# Patient Record
Sex: Female | Born: 1970 | Race: White | Hispanic: No | Marital: Single | Smoking: Current every day smoker
Health system: Southern US, Community
[De-identification: ages and names within clinical notes are randomized; demographics above are authoritative.]

---

## 2005-04-26 ENCOUNTER — Other Ambulatory Visit: Admission: RE | Admit: 2005-04-26 | Discharge: 2005-04-26 | Payer: Self-pay | Admitting: Obstetrics and Gynecology

## 2007-01-08 ENCOUNTER — Other Ambulatory Visit: Admission: RE | Admit: 2007-01-08 | Discharge: 2007-01-08 | Payer: Self-pay | Admitting: Obstetrics and Gynecology

## 2007-05-25 ENCOUNTER — Encounter: Admission: RE | Admit: 2007-05-25 | Discharge: 2007-05-25 | Payer: Self-pay | Admitting: Family Medicine

## 2008-03-31 ENCOUNTER — Encounter: Admission: RE | Admit: 2008-03-31 | Discharge: 2008-03-31 | Payer: Self-pay | Admitting: Family Medicine

## 2015-06-05 ENCOUNTER — Telehealth: Payer: Self-pay | Admitting: Gynecology

## 2015-06-05 NOTE — Telephone Encounter (Signed)
Erroneous entry

## 2016-11-27 ENCOUNTER — Encounter (HOSPITAL_COMMUNITY): Payer: Self-pay | Admitting: Emergency Medicine

## 2016-11-27 ENCOUNTER — Emergency Department (HOSPITAL_COMMUNITY): Payer: 59

## 2016-11-27 ENCOUNTER — Emergency Department (HOSPITAL_COMMUNITY)
Admission: EM | Admit: 2016-11-27 | Discharge: 2016-11-27 | Disposition: A | Discharge status: Home / Self Care | Payer: 59 | Attending: Emergency Medicine | Admitting: Emergency Medicine

## 2016-11-27 DIAGNOSIS — Z5181 Encounter for therapeutic drug level monitoring: Secondary | ICD-10-CM | POA: Diagnosis not present

## 2016-11-27 DIAGNOSIS — R55 Syncope and collapse: Secondary | ICD-10-CM

## 2016-11-27 DIAGNOSIS — F17200 Nicotine dependence, unspecified, uncomplicated: Secondary | ICD-10-CM | POA: Diagnosis not present

## 2016-11-27 DIAGNOSIS — F10120 Alcohol abuse with intoxication, uncomplicated: Secondary | ICD-10-CM | POA: Insufficient documentation

## 2016-11-27 DIAGNOSIS — F10920 Alcohol use, unspecified with intoxication, uncomplicated: Secondary | ICD-10-CM

## 2016-11-27 LAB — COMPREHENSIVE METABOLIC PANEL
ALK PHOS: 47 U/L (ref 38–126)
ALT: 14 U/L (ref 14–54)
ANION GAP: 8 (ref 5–15)
AST: 20 U/L (ref 15–41)
Albumin: 3.7 g/dL (ref 3.5–5.0)
BILIRUBIN TOTAL: 0.3 mg/dL (ref 0.3–1.2)
BUN: 7 mg/dL (ref 6–20)
CALCIUM: 8.1 mg/dL — AB (ref 8.9–10.3)
CO2: 22 mmol/L (ref 22–32)
CREATININE: 0.67 mg/dL (ref 0.44–1.00)
Chloride: 104 mmol/L (ref 101–111)
GLUCOSE: 105 mg/dL — AB (ref 65–99)
Potassium: 3.9 mmol/L (ref 3.5–5.1)
Sodium: 134 mmol/L — ABNORMAL LOW (ref 135–145)
Total Protein: 7.3 g/dL (ref 6.5–8.1)

## 2016-11-27 LAB — I-STAT TROPONIN, ED: Troponin i, poc: 0 ng/mL (ref 0.00–0.08)

## 2016-11-27 LAB — CBC WITH DIFFERENTIAL/PLATELET
Basophils Absolute: 0 10*3/uL (ref 0.0–0.1)
Basophils Relative: 0 %
EOS ABS: 0.1 10*3/uL (ref 0.0–0.7)
EOS PCT: 1 %
HCT: 36.4 % (ref 36.0–46.0)
Hemoglobin: 12.4 g/dL (ref 12.0–15.0)
LYMPHS ABS: 1.6 10*3/uL (ref 0.7–4.0)
Lymphocytes Relative: 17 %
MCH: 34 pg (ref 26.0–34.0)
MCHC: 34.1 g/dL (ref 30.0–36.0)
MCV: 99.7 fL (ref 78.0–100.0)
MONOS PCT: 4 %
Monocytes Absolute: 0.4 10*3/uL (ref 0.1–1.0)
Neutro Abs: 7.1 10*3/uL (ref 1.7–7.7)
Neutrophils Relative %: 78 %
PLATELETS: 203 10*3/uL (ref 150–400)
RBC: 3.65 MIL/uL — ABNORMAL LOW (ref 3.87–5.11)
RDW: 12.1 % (ref 11.5–15.5)
WBC: 9.1 10*3/uL (ref 4.0–10.5)

## 2016-11-27 LAB — URINALYSIS, ROUTINE W REFLEX MICROSCOPIC
Bilirubin Urine: NEGATIVE
Glucose, UA: NEGATIVE mg/dL
HGB URINE DIPSTICK: NEGATIVE
Ketones, ur: NEGATIVE mg/dL
LEUKOCYTES UA: NEGATIVE
Nitrite: NEGATIVE
PROTEIN: NEGATIVE mg/dL
Specific Gravity, Urine: 1.002 — ABNORMAL LOW (ref 1.005–1.030)
pH: 5 (ref 5.0–8.0)

## 2016-11-27 LAB — CK: CK TOTAL: 82 U/L (ref 38–234)

## 2016-11-27 LAB — RAPID URINE DRUG SCREEN, HOSP PERFORMED
Amphetamines: NOT DETECTED
BARBITURATES: NOT DETECTED
Benzodiazepines: NOT DETECTED
Cocaine: NOT DETECTED
Opiates: NOT DETECTED
Tetrahydrocannabinol: NOT DETECTED

## 2016-11-27 LAB — ETHANOL: Alcohol, Ethyl (B): 192 mg/dL — ABNORMAL HIGH (ref <5)

## 2016-11-27 LAB — I-STAT BETA HCG BLOOD, ED (MC, WL, AP ONLY)

## 2016-11-27 LAB — I-STAT CG4 LACTIC ACID, ED: Lactic Acid, Venous: 1.69 mmol/L (ref 0.5–1.9)

## 2016-11-27 NOTE — ED Notes (Signed)
Bed: WA04 Expected date:  Expected time:  Means of arrival:  Comments: 46 yo F/ Etoh Near syncope

## 2016-11-27 NOTE — Discharge Instructions (Signed)
Please follow up with the neurologist to arrange an EEG (brainwave study).

## 2016-11-27 NOTE — ED Triage Notes (Addendum)
Per EMS , pt. From a bar reported of syncopal episode at around 1am , according to pt.'s friend they were able to catch pt. Before falling to the ground. Episode lasted less than 5 mins. Pt. Admitted to drinking of 8 bottles of beers last night. Denied of any pain . Pt. Alert and oriented x4 upon arrival to ED. Cooperative and pleasant.

## 2016-11-27 NOTE — ED Provider Notes (Signed)
[facility]-EMERGENCY DEPT Provider Note   CSN: [id number] Arrival date & time: 11/27/16  0133  By signing my name below, I, Holly Dennis, attest that this documentation has been prepared under the direction and in the presence of [name], MD. Electronically Signed: Diona Dennis, ED Scribe. 11/27/16. 2:21 AM.   History   Chief Complaint Chief Complaint  Patient presents with  . Loss of Consciousness  . Alcohol Intoxication    HPI Holly Dennis is a 46 y.o. female BIB EMS, who presents to the Emergency Department s/p sudden LOC ~1 am. Pt was at a bar where she consumed 1 shot and ~8 beers over an extended period of time, when she became "rigid" and starting falling to the ground when she was caught by a friend. No head injury. Per pt's friends, she was foaming at the mouth and unresponsive. They had difficulty trying to find her pulse. As pt regained responsiveness she began mumbling and then asking to sit up. She reports not biting her tongue or lip, however, she did lose control of her bowels. Prior to onset, pt's friends, stated she was coherent and was about to get an Benedetto Goad to go home. Pt notes that she feels okay now, just tired, but not in any pain. She notes similar symptoms ~10 years ago, however, she didn't seek medical attention at that time. She takes citalopram daily for depression. She reports drinking "a few beers" daily after work and smoking ~1 pack per 4 days. She denies using drugs. Pt denies nausea, vomiting and any other associated symptoms.   The history is provided by the patient and a friend. No language interpreter was used.    History reviewed. No pertinent past medical history.  There are no active problems to display for this patient.   History reviewed. No pertinent surgical history.  OB History    No data available       Home Medications    Prior to Admission medications   Not on File    Family History No family history on file.  Social  History Social History  Substance Use Topics  . Smoking status: Current Every Day Smoker    Packs/day: 0.50    Years: 10.00  . Smokeless tobacco: Not on file  . Alcohol use Yes     Allergies   Patient has no allergy information on record.   Review of Systems Review of Systems  Cardiovascular: Positive for syncope.  Gastrointestinal: Negative for nausea and vomiting.  Neurological: Positive for syncope.  All other systems reviewed and are negative.    Physical Exam Updated Vital Signs BP 115/80 (BP Location: Left Arm)   Pulse 94   Temp 97.5 F (36.4 C) (Oral)   Resp 18   LMP 11/10/2016   SpO2 100%   Physical Exam  Constitutional: She is oriented to person, place, and time. She appears well-developed and well-nourished.  HENT:  Head: Normocephalic and atraumatic.  Eyes: EOM are normal. Pupils are equal, round, and reactive to light.  Neck: Normal range of motion. Neck supple. No JVD present.  Cardiovascular: Normal rate, regular rhythm and normal heart sounds.   No murmur heard. Pulmonary/Chest: Effort normal and breath sounds normal. She has no wheezes. She has no rales. She exhibits no tenderness.  Abdominal: Soft. Bowel sounds are normal. She exhibits no distension and no mass. There is no tenderness.  Musculoskeletal: Normal range of motion. She exhibits no edema.  Lymphadenopathy:    She has no cervical  adenopathy.  Neurological: She is alert and oriented to person, place, and time. No cranial nerve deficit. She exhibits normal muscle tone. Coordination normal.  Skin: Skin is warm and dry. No rash noted.  Psychiatric: She has a normal mood and affect. Her behavior is normal. Judgment and thought content normal.  Nursing note and vitals reviewed.    ED Treatments / Results  DIAGNOSTIC STUDIES: Oxygen Saturation is 100% on RA, normal by my interpretation.   COORDINATION OF CARE: 2:04 AM-Discussed next steps with pt include a CT scan. Pt verbalized  understanding and is agreeable with the plan.    Labs (all labs ordered are listed, but only abnormal results are displayed) Labs Reviewed  COMPREHENSIVE METABOLIC PANEL - Abnormal; Notable for the following:       Result Value   Sodium 134 (*)    Glucose, Bld 105 (*)    Calcium 8.1 (*)    All other components within normal limits  ETHANOL - Abnormal; Notable for the following:    Alcohol, Ethyl (B) 192 (*)    All other components within normal limits  CBC WITH DIFFERENTIAL/PLATELET - Abnormal; Notable for the following:    RBC 3.65 (*)    All other components within normal limits  URINALYSIS, ROUTINE W REFLEX MICROSCOPIC - Abnormal; Notable for the following:    Color, Urine STRAW (*)    Specific Gravity, Urine 1.002 (*)    All other components within normal limits  RAPID URINE DRUG SCREEN, HOSP PERFORMED  CK  I-STAT BETA HCG BLOOD, ED ([facility], [facility], [facility] ONLY)  I-STAT CG4 LACTIC ACID, ED  I-STAT TROPOININ, ED    EKG  EKG Interpretation  Date/Time:  Sunday November 27 2016 02:12:46 EDT Ventricular Rate:  90 PR Interval:    QRS Duration: 89 QT Interval:  378 QTC Calculation: 463 R Axis:   73 Text Interpretation:  Sinus rhythm Normal ECG No old tracing to compare Confirmed by [facility]  MD, Sravya Grissom ([id number]) on 11/27/2016 2:40:20 AM       Radiology Ct Head Wo Contrast  Result Date: 11/27/2016 CLINICAL DATA:  Syncopal episode. Possible seizure. Frontal headache. Patient did not hit the head. EXAM: CT HEAD WITHOUT CONTRAST TECHNIQUE: Contiguous axial images were obtained from the base of the skull through the vertex without intravenous contrast. COMPARISON:  05/25/2007 FINDINGS: Brain: No evidence of acute infarction, hemorrhage, hydrocephalus, extra-axial collection or mass lesion/mass effect. Vascular: No hyperdense vessel or unexpected calcification. Skull: Normal. Negative for fracture or focal lesion. Sinuses/Orbits: Ethmoid air cells are opacified bilaterally. Mucosal thickening in the  sphenoid sinuses. Mastoid air cells are not opacified. Other: None. IMPRESSION: No acute intracranial abnormalities. Opacification of ethmoid air cells with mucosal thickening in the paranasal sinuses probably representing chronic sinus disease. Acute sinusitis not entirely excluded. Electronically Signed   By: Burman Nieves M.D.   On: 11/27/2016 03:07    Procedures Procedures (including critical care time)  Medications Ordered in ED Medications - No data to display   Initial Impression / Assessment and Plan / ED Course  I have reviewed the triage vital signs and the nursing notes.  Pertinent labs & imaging results that were available during my care of the patient were reviewed by me and considered in my medical decision making (see chart for details).  Syncopal episode with components which sound as if they might be seizure. Specifically, the fact that she was tensed and rigid, and had fecal incontinence. Workup is initiated. CT of head is unremarkable. Ethanol  level is in the range of intoxication but not excessive. She had normal CK and normal lactic acid which argue against seizure. She has had no further episodes while in the ED. At this point, I am not clear if she her syncopal episode is just related to alcohol whether it was a seizure. She is referred to neurology for outpatient EEG.  Final Clinical Impressions(s) / ED Diagnoses   Final diagnoses:  Syncope and collapse  Alcohol intoxication, uncomplicated (HCC)    New Prescriptions New Prescriptions   No medications on file   I personally performed the services described in this documentation, which was scribed in my presence. The recorded information has been reviewed and is accurate.      Dione Boozeavid Armoni Depass, MD 11/27/16 [phone]

## 2016-11-27 NOTE — ED Notes (Signed)
Pt said she is unable to urinate

## 2017-06-27 ENCOUNTER — Other Ambulatory Visit: Payer: Self-pay | Admitting: Obstetrics and Gynecology

## 2017-06-27 DIAGNOSIS — R928 Other abnormal and inconclusive findings on diagnostic imaging of breast: Secondary | ICD-10-CM

## 2017-07-04 ENCOUNTER — Other Ambulatory Visit: Payer: Self-pay | Admitting: Obstetrics and Gynecology

## 2017-07-04 ENCOUNTER — Ambulatory Visit
Admission: RE | Admit: 2017-07-04 | Discharge: 2017-07-04 | Disposition: A | Discharge status: Home / Self Care | Payer: 59 | Source: Ambulatory Visit | Attending: Obstetrics and Gynecology | Admitting: Obstetrics and Gynecology

## 2017-07-04 DIAGNOSIS — R928 Other abnormal and inconclusive findings on diagnostic imaging of breast: Secondary | ICD-10-CM

## 2017-07-04 DIAGNOSIS — N63 Unspecified lump in unspecified breast: Secondary | ICD-10-CM

## 2018-01-08 ENCOUNTER — Ambulatory Visit
Admission: RE | Admit: 2018-01-08 | Discharge: 2018-01-08 | Disposition: A | Discharge status: Home / Self Care | Payer: Managed Care, Other (non HMO) | Source: Ambulatory Visit | Attending: Obstetrics and Gynecology | Admitting: Obstetrics and Gynecology

## 2018-01-08 ENCOUNTER — Other Ambulatory Visit: Payer: Self-pay | Admitting: Obstetrics and Gynecology

## 2018-01-08 DIAGNOSIS — N63 Unspecified lump in unspecified breast: Secondary | ICD-10-CM

## 2018-07-11 ENCOUNTER — Other Ambulatory Visit: Payer: Self-pay | Admitting: Obstetrics and Gynecology

## 2018-07-11 DIAGNOSIS — N63 Unspecified lump in unspecified breast: Secondary | ICD-10-CM

## 2018-07-12 ENCOUNTER — Ambulatory Visit
Admission: RE | Admit: 2018-07-12 | Discharge: 2018-07-12 | Disposition: A | Discharge status: Home / Self Care | Payer: Managed Care, Other (non HMO) | Source: Ambulatory Visit | Attending: Obstetrics and Gynecology | Admitting: Obstetrics and Gynecology

## 2018-07-12 ENCOUNTER — Other Ambulatory Visit: Payer: Self-pay | Admitting: Obstetrics and Gynecology

## 2018-07-12 ENCOUNTER — Other Ambulatory Visit: Payer: Managed Care, Other (non HMO)

## 2018-07-12 DIAGNOSIS — N63 Unspecified lump in unspecified breast: Secondary | ICD-10-CM

## 2019-01-16 ENCOUNTER — Other Ambulatory Visit: Payer: Managed Care, Other (non HMO)

## 2019-02-11 ENCOUNTER — Other Ambulatory Visit: Payer: Self-pay

## 2019-02-11 ENCOUNTER — Ambulatory Visit
Admission: RE | Admit: 2019-02-11 | Discharge: 2019-02-11 | Disposition: A | Discharge status: Home / Self Care | Payer: Managed Care, Other (non HMO) | Source: Ambulatory Visit | Attending: Obstetrics and Gynecology | Admitting: Obstetrics and Gynecology

## 2019-02-11 DIAGNOSIS — N63 Unspecified lump in unspecified breast: Secondary | ICD-10-CM

## 2019-06-04 ENCOUNTER — Encounter: Payer: Self-pay | Admitting: Gynecology

## 2019-11-21 ENCOUNTER — Other Ambulatory Visit: Payer: Self-pay

## 2019-11-21 ENCOUNTER — Ambulatory Visit: Payer: Managed Care, Other (non HMO) | Attending: Internal Medicine

## 2019-11-21 DIAGNOSIS — Z23 Encounter for immunization: Secondary | ICD-10-CM

## 2019-11-21 NOTE — Progress Notes (Signed)
   Covid-19 Vaccination Clinic  Name:  Holly Dennis    MRN: [id number] DOB: 1970-12-20  11/21/2019  [name] was observed post Covid-19 immunization for 15 minutes without incident. She was provided with Vaccine Information Sheet and instruction to access the V-Safe system.   [name] was instructed to call 911 with any severe reactions post vaccine: Marland Kitchen Difficulty breathing  . Swelling of face and throat  . A fast heartbeat  . A bad rash all over body  . Dizziness and weakness   Immunizations Administered    Name Date Dose VIS Date Route   Pfizer COVID-19 Vaccine 11/21/2019 11:22 AM 0.3 mL 08/23/2019 Intramuscular   Manufacturer: [employer], [employer]   Lot: [id number]   NDC: [address]-1

## 2019-12-17 ENCOUNTER — Ambulatory Visit: Payer: Managed Care, Other (non HMO) | Attending: Internal Medicine

## 2019-12-17 DIAGNOSIS — Z23 Encounter for immunization: Secondary | ICD-10-CM

## 2019-12-17 NOTE — Progress Notes (Signed)
   Covid-19 Vaccination Clinic  Name:  NAYELLY LAUGHMAN    MRN: [id number] DOB: October 26, 1970  12/17/2019  Ms. Furgason was observed post Covid-19 immunization for 15 minutes without incident. She was provided with Vaccine Information Sheet and instruction to access the V-Safe system.   Ms. Spees was instructed to call 911 with any severe reactions post vaccine: Marland Kitchen Difficulty breathing  . Swelling of face and throat  . A fast heartbeat  . A bad rash all over body  . Dizziness and weakness   Immunizations Administered    Name Date Dose VIS Date Route   Pfizer COVID-19 Vaccine 12/17/2019  2:00 PM 0.3 mL 08/23/2019 Intramuscular   Manufacturer: [employer], [employer]   Lot: [id number]   NDC: [address]-4

## 2020-08-19 IMAGING — US ULTRASOUND RIGHT BREAST LIMITED
1 series · 9 of 9 positions shown · non-contrast
Comparison: Previous exam(s).

CLINICAL DATA: Short-term follow-up for bilateral breast masses,
initially assessed in June 2017.

EXAM:
ULTRASOUND OF THE BILATERAL BREAST

[Series 1: ultrasound right breast limited · 0.06mm/px · 9 of 9 slices shown]
[im 1/9]
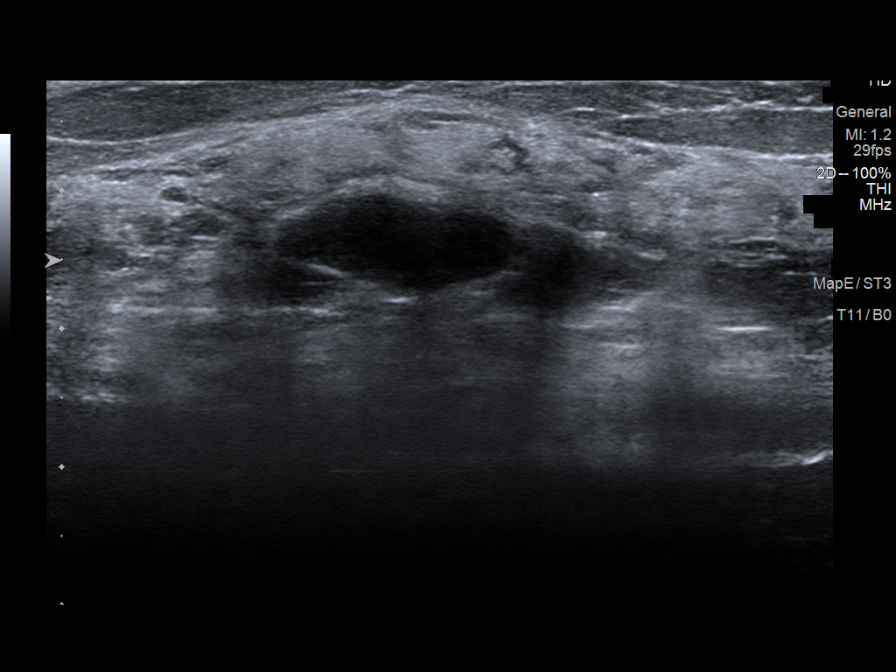
[im 2/9]
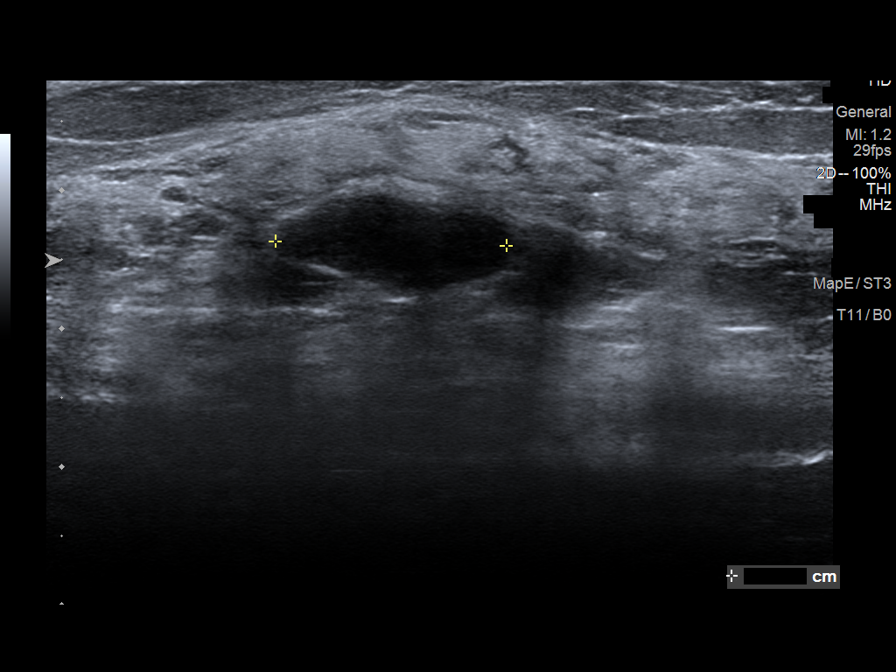
[im 3/9]
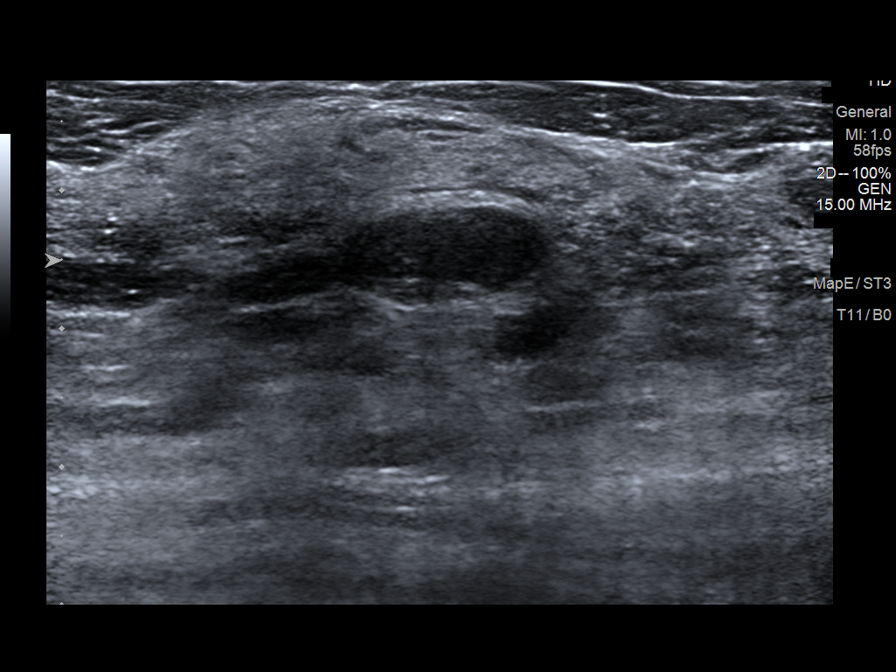
[im 4/9]
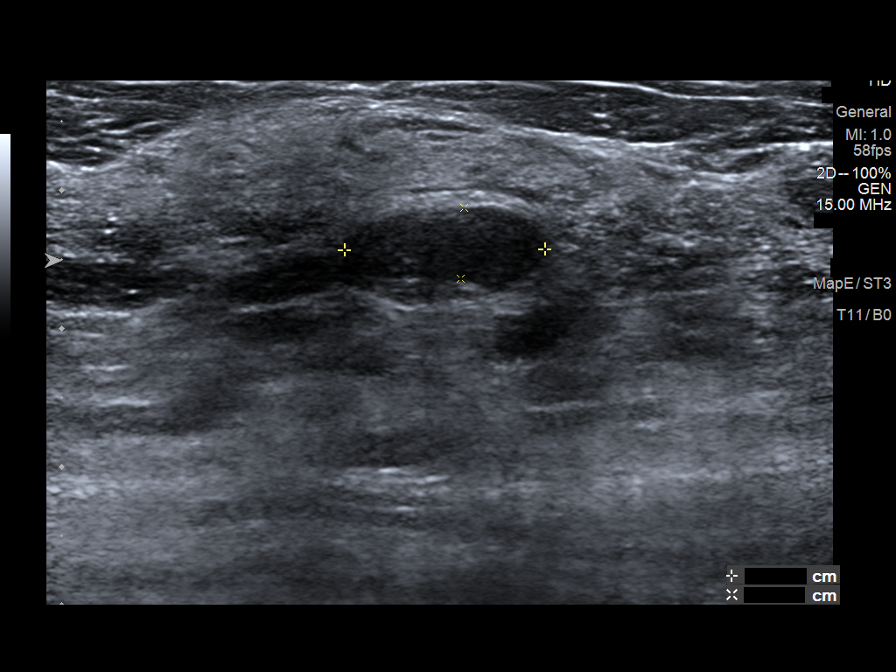
[im 5/9]
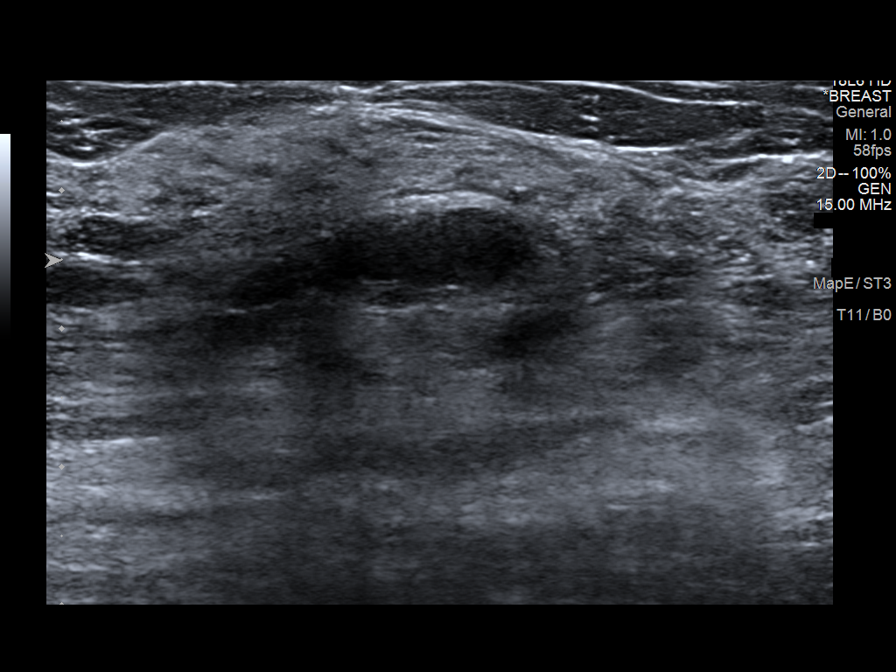
[im 6/9]
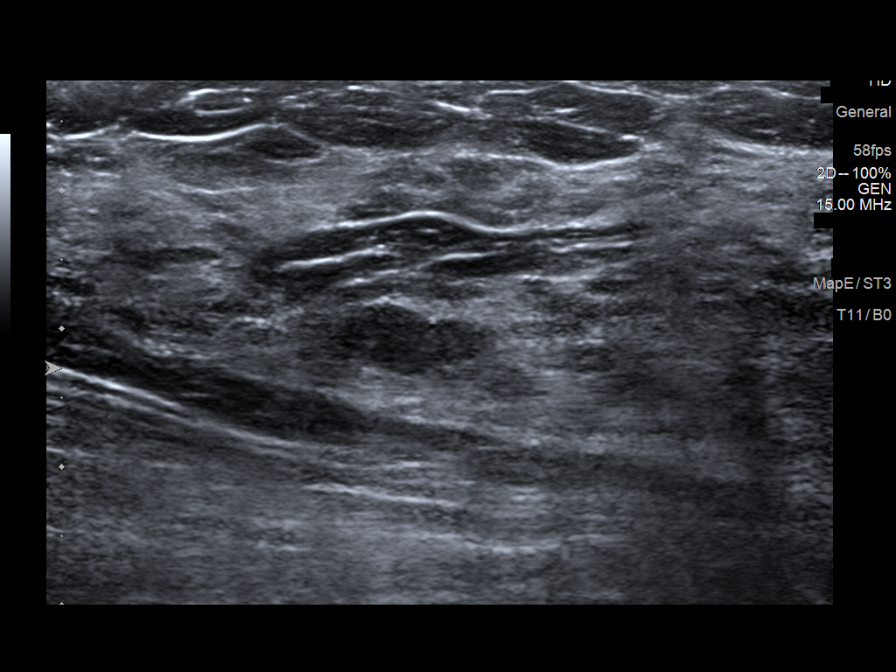
[im 7/9]
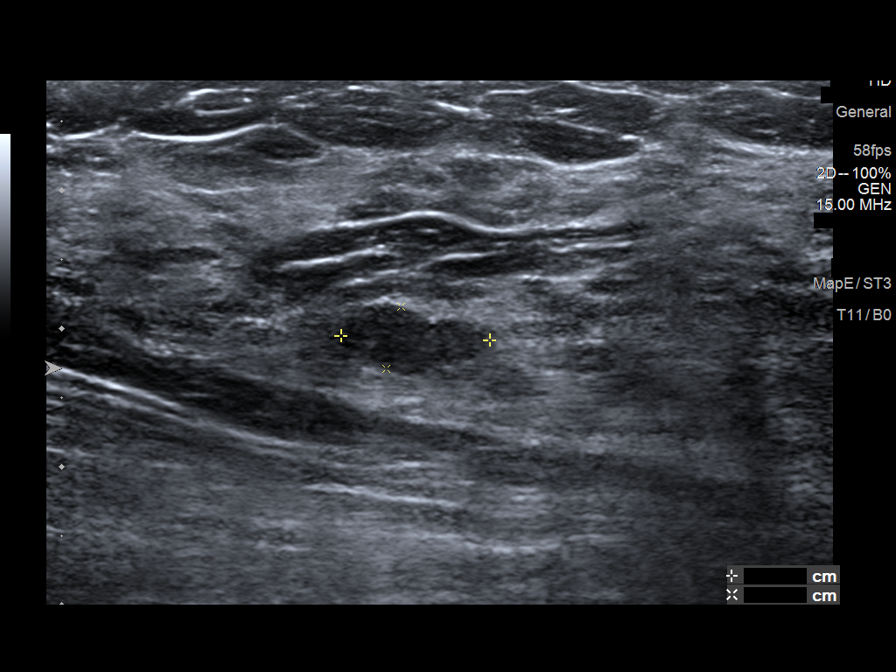
[im 8/9]
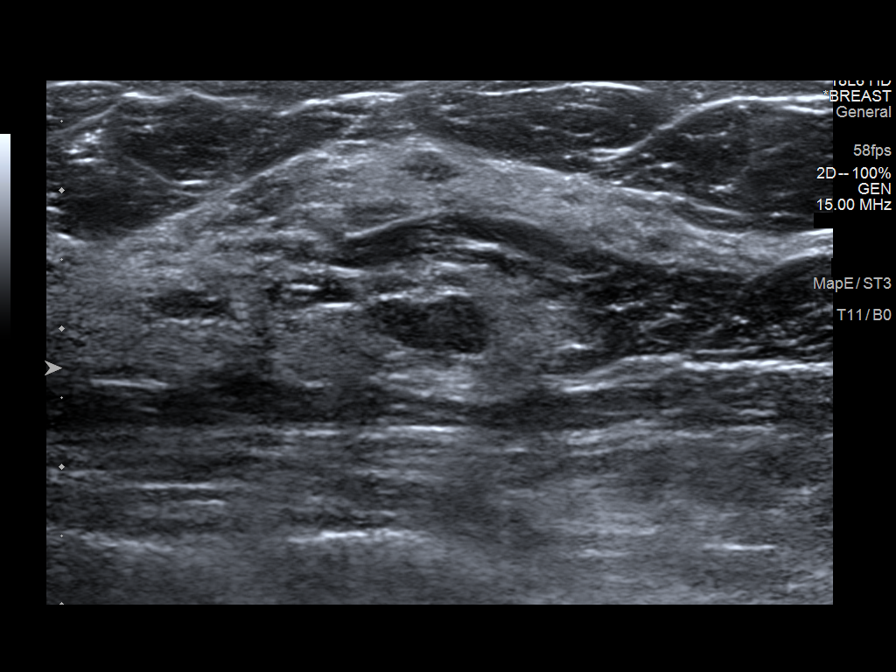
[im 9/9]
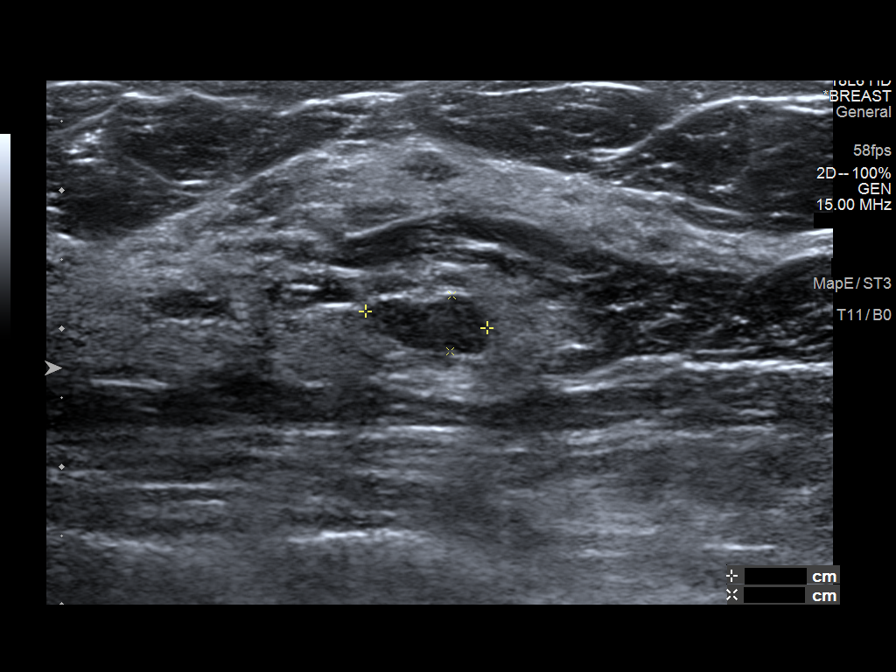

[9 of 9 positions shown; findings below may reference images not displayed]

FINDINGS: Targeted left breast ultrasound is performed, showing a small
hypoechoic circumscribed mass at 2 o'clock, 6 cm the nipple,
measuring 7 x 5 x 6 mm, decreased from 12 x 5 x 10 mm on 07/04/2017.

Targeted right breast ultrasound is performed, showing a hypoechoic
circumscribed oval mass at 10 o'clock, 8 cm the nipple, measuring 11
x 5 x 9 mm, previously 11 x 6 x 11 mm. At 9:30 o'clock, 5 cm the
nipple, there is a hypoechoic oval circumscribed mass measuring 17 x
5 x 15 mm, unchanged from the 07/04/2017 exam.
IMPRESSION: Benign bilateral breast masses consistent with fibroadenomas. No
additional short-term follow-up recommended.

RECOMMENDATION:
Screening mammogram in June 2019.(Code:[id number]-[id number])

I have discussed the findings and recommendations with the patient.
Results were also provided in writing at the conclusion of the
visit. If applicable, a reminder letter will be sent to the patient
regarding the next appointment.

BI-RADS CATEGORY  2: Benign.

## 2020-08-19 IMAGING — US US BREAST*L* LIMITED INC AXILLA
1 series · 4 of 4 positions shown · non-contrast
Comparison: Previous exam(s).

CLINICAL DATA: Short-term follow-up for bilateral breast masses,
initially assessed in June 2017.

EXAM:
ULTRASOUND OF THE BILATERAL BREAST

[Series 1: us breast*left* limited inc axilla · 0.07mm/px · 4 of 4 slices shown]
[im 1/4]
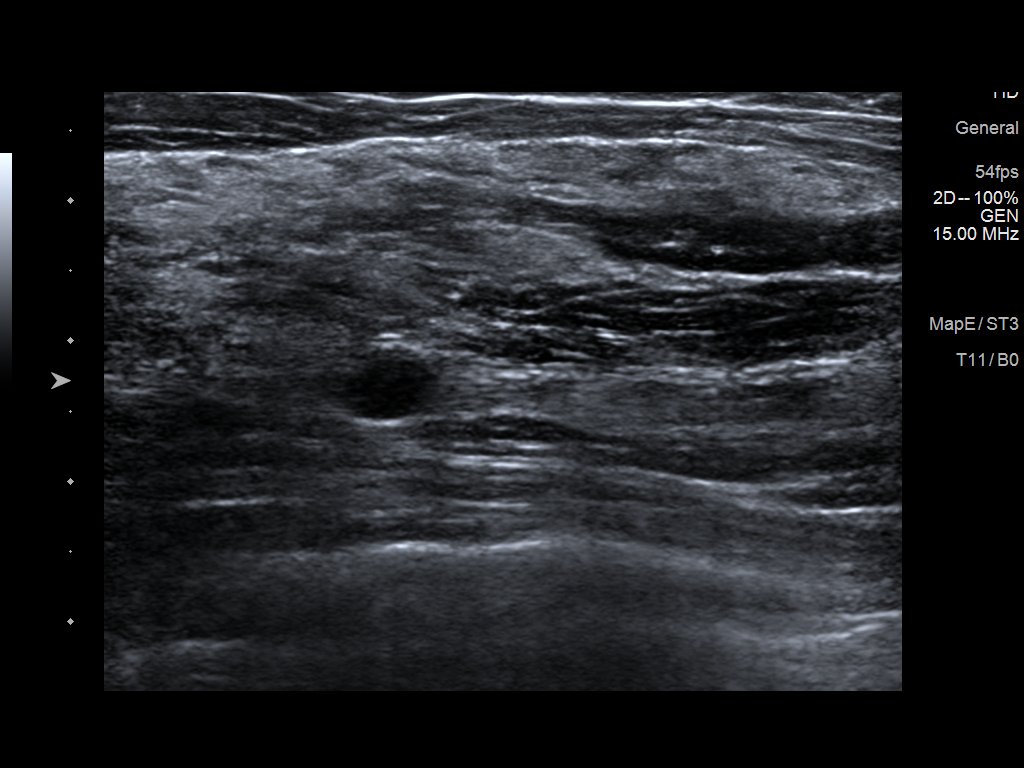
[im 2/4]
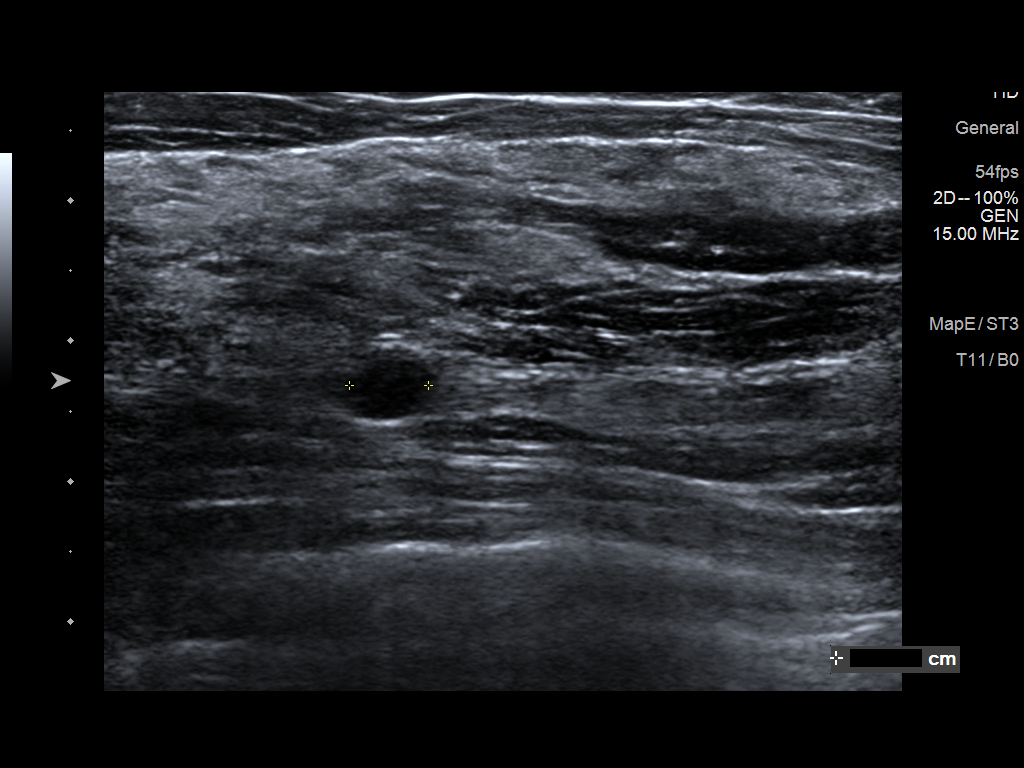
[im 3/4]
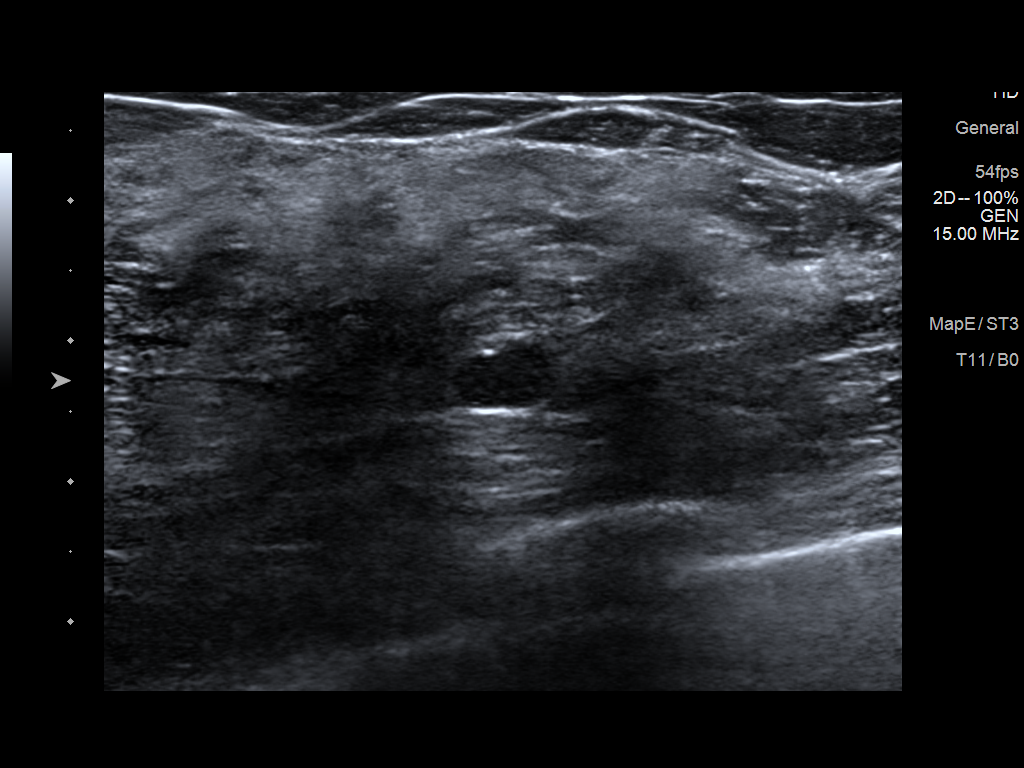
[im 4/4]
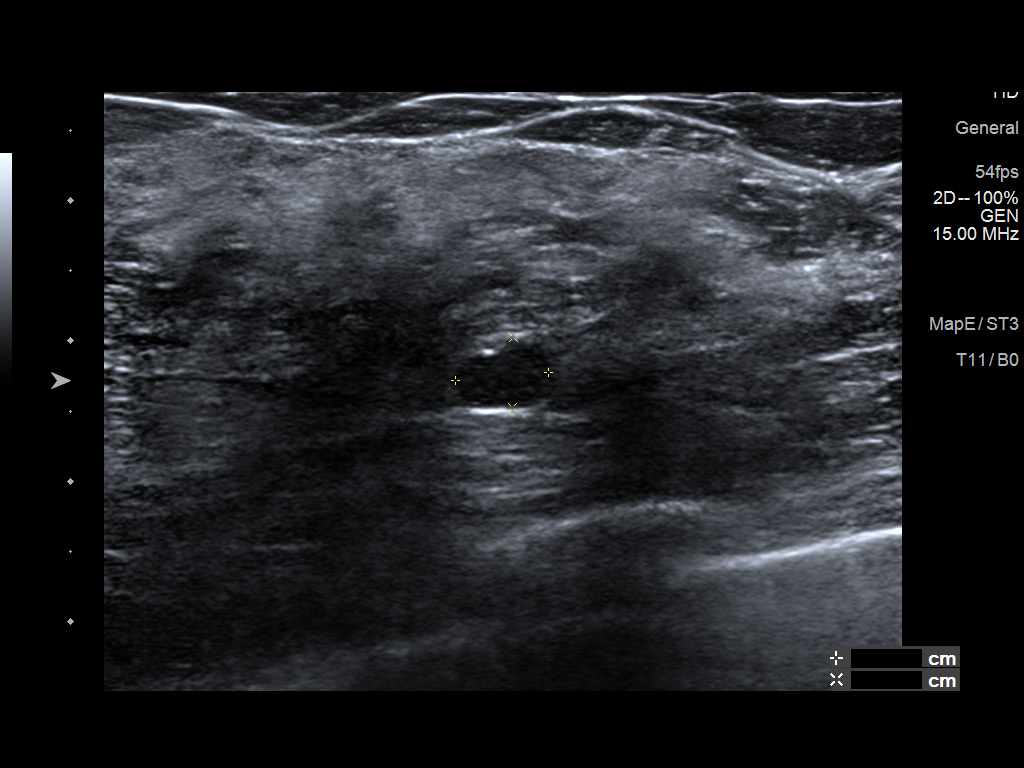

[4 of 4 positions shown; findings below may reference images not displayed]

FINDINGS: Targeted left breast ultrasound is performed, showing a small
hypoechoic circumscribed mass at 2 o'clock, 6 cm the nipple,
measuring 7 x 5 x 6 mm, decreased from 12 x 5 x 10 mm on 07/04/2017.

Targeted right breast ultrasound is performed, showing a hypoechoic
circumscribed oval mass at 10 o'clock, 8 cm the nipple, measuring 11
x 5 x 9 mm, previously 11 x 6 x 11 mm. At 9:30 o'clock, 5 cm the
nipple, there is a hypoechoic oval circumscribed mass measuring 17 x
5 x 15 mm, unchanged from the 07/04/2017 exam.
IMPRESSION: Benign bilateral breast masses consistent with fibroadenomas. No
additional short-term follow-up recommended.

RECOMMENDATION:
Screening mammogram in June 2019.(Code:[id number])

I have discussed the findings and recommendations with the patient.
Results were also provided in writing at the conclusion of the
visit. If applicable, a reminder letter will be sent to the patient
regarding the next appointment.

BI-RADS CATEGORY  2: Benign.
